# Patient Record
Sex: Male | Born: 1975 | Race: White | Hispanic: No | Marital: Single | Smoking: Former smoker
Health system: Southern US, Community
[De-identification: ages and names within clinical notes are randomized; demographics above are authoritative.]

---

## 2012-07-12 ENCOUNTER — Emergency Department (HOSPITAL_BASED_OUTPATIENT_CLINIC_OR_DEPARTMENT_OTHER)
Admission: EM | Admit: 2012-07-12 | Discharge: 2012-07-13 | Disposition: A | Discharge status: Home / Self Care | Payer: PRIVATE HEALTH INSURANCE | Attending: Emergency Medicine | Admitting: Emergency Medicine

## 2012-07-12 ENCOUNTER — Encounter (HOSPITAL_BASED_OUTPATIENT_CLINIC_OR_DEPARTMENT_OTHER): Payer: Self-pay | Admitting: *Deleted

## 2012-07-12 DIAGNOSIS — N390 Urinary tract infection, site not specified: Secondary | ICD-10-CM | POA: Insufficient documentation

## 2012-07-12 LAB — URINALYSIS, ROUTINE W REFLEX MICROSCOPIC
Protein, ur: 100 mg/dL — AB
Specific Gravity, Urine: 1.017 (ref 1.005–1.030)
Urobilinogen, UA: 1 mg/dL (ref 0.0–1.0)

## 2012-07-12 LAB — URINE MICROSCOPIC-ADD ON

## 2012-07-12 NOTE — ED Notes (Signed)
Pt c/o sudden onset of right flnak pain freq painful urination

## 2012-07-13 ENCOUNTER — Emergency Department (HOSPITAL_BASED_OUTPATIENT_CLINIC_OR_DEPARTMENT_OTHER): Payer: PRIVATE HEALTH INSURANCE

## 2012-07-13 MED ORDER — CIPROFLOXACIN HCL 500 MG PO TABS
500.0000 mg | ORAL_TABLET | Freq: Once | ORAL | Status: AC
  Administered 2012-07-13: 500 mg via ORAL
  Filled 2012-07-13: qty 1

## 2012-07-13 MED ORDER — CIPROFLOXACIN HCL 500 MG PO TABS: 500.0000 mg | ORAL_TABLET | Freq: Two times a day (BID) | ORAL | Status: AC

## 2012-07-13 MED ORDER — HYDROMORPHONE HCL 2 MG PO TABS: 2.0000 mg | ORAL_TABLET | ORAL | Status: AC | PRN

## 2012-07-13 NOTE — ED Provider Notes (Signed)
History   This chart was scribed for Hanley Seamen, MD by Sofie Rower. The patient was seen in room MH05/MH05 and the patient's care was started at 12:02AM.     CSN: [id number]  Arrival date & time 07/12/12  2312   First MD Initiated Contact with Patient 07/13/12 0002      Chief Complaint  Patient presents with  . Flank Pain    (Consider location/radiation/quality/duration/timing/severity/associated sxs/prior treatment) Patient is a 36 y.o. male presenting with flank pain. The history is provided by the patient. No language interpreter was used.  Flank Pain This is a new problem. The current episode started yesterday. The problem occurs constantly. The problem has been gradually worsening. Pertinent negatives include no chest pain, no abdominal pain, no headaches and no shortness of breath. Nothing aggravates the symptoms. Nothing relieves the symptoms. He has tried nothing for the symptoms. The treatment provided no relief.    Dustin Padilla is a 36 y.o. male  who presents to the Emergency Department complaining of sudden, progressively worsening, flank pain, located at the right flank, onset yesterday, with associated symptoms of dysuria and hematuria. The pt reports the flank pain he is experiencing is a constant pulsating sensation.   The pt denies chills, nausea, penile discharge and any hx of kidney stones.   The pt does not smoke or drink alcohol.   History reviewed. No pertinent past medical history.  History reviewed. No pertinent past surgical history.  History reviewed. No pertinent family history.  History  Substance Use Topics  . Smoking status: Former Games developer  . Smokeless tobacco: Not on file  . Alcohol Use: No      Review of Systems  Respiratory: Negative for shortness of breath.   Cardiovascular: Negative for chest pain.  Gastrointestinal: Negative for abdominal pain.  Genitourinary: Positive for flank pain.  Neurological: Negative for headaches.  All  other systems reviewed and are negative.    Allergies  Review of patient's allergies indicates no known allergies.  Home Medications  No current outpatient prescriptions on file.  BP 129/66  Pulse 73  Temp 98.7 F (37.1 C) (Oral)  Ht 5\' 11"  (1.803 m)  Wt 140 lb (63.504 kg)  BMI 19.53 kg/m2  SpO2 99%  Physical Exam  Nursing note and vitals reviewed. Constitutional: He is oriented to person, place, and time. He appears well-developed and well-nourished.  HENT:  Head: Atraumatic.  Right Ear: External ear normal.  Left Ear: External ear normal.  Nose: Nose normal.  Eyes: Conjunctivae normal and EOM are normal. Pupils are equal, round, and reactive to light.  Neck: Normal range of motion. Neck supple.  Cardiovascular: Normal rate, regular rhythm and normal heart sounds.   Pulmonary/Chest: Effort normal and breath sounds normal.  Abdominal: Soft. Bowel sounds are normal.  Genitourinary: Penis normal.       No penile discharge or erythema detected.   Musculoskeletal: Normal range of motion. He exhibits no tenderness.  Neurological: He is alert and oriented to person, place, and time.  Skin: Skin is warm and dry.  Psychiatric: He has a normal mood and affect. His behavior is normal.    ED Course  Procedures (including critical care time)  DIAGNOSTIC STUDIES: Oxygen Saturation is 99% on room air, normal by my interpretation.    COORDINATION OF CARE:    12:05AM- CT scan and pain management discussed with patient. Pt agrees with treatment.     MDM   Nursing notes and vitals signs, including pulse oximetry,  reviewed.  Summary of this visit's results, reviewed by myself:  Labs:  Results for orders placed during the hospital encounter of 07/12/12  URINALYSIS, ROUTINE W REFLEX MICROSCOPIC      Component Value Range   Color, Urine YELLOW  YELLOW   APPearance CLOUDY (*) CLEAR   Specific Gravity, Urine 1.017  1.005 - 1.030   pH 6.5  5.0 - 8.0   Glucose, UA NEGATIVE   NEGATIVE mg/dL   Hgb urine dipstick LARGE (*) NEGATIVE   Bilirubin Urine NEGATIVE  NEGATIVE   Ketones, ur NEGATIVE  NEGATIVE mg/dL   Protein, ur 161 (*) NEGATIVE mg/dL   Urobilinogen, UA 1.0  0.0 - 1.0 mg/dL   Nitrite POSITIVE (*) NEGATIVE   Leukocytes, UA LARGE (*) NEGATIVE  URINE MICROSCOPIC-ADD ON      Component Value Range   Squamous Epithelial / LPF FEW (*) RARE   WBC, UA TOO NUMEROUS TO COUNT  <3 WBC/hpf   RBC / HPF TOO NUMEROUS TO COUNT  <3 RBC/hpf   Bacteria, UA MANY (*) RARE    Imaging Studies: Ct Abdomen Pelvis Wo Contrast  07/13/2012  *RADIOLOGY REPORT*  Clinical Data: 36 year old male right flank pain and hematuria.  CT ABDOMEN AND PELVIS WITHOUT CONTRAST  Technique:  Multidetector CT imaging of the abdomen and pelvis was performed following the standard protocol without intravenous contrast.  Comparison: None.  Findings: Clear lung bases.  No pericardial or pleural effusion. No osseous abnormality.  No pelvic free fluid.  Redundant but otherwise negative distal colon.  Mildly redundant splenic and hepatic flexures.  The cecum is located in the right hemi pelvis.  Normal appendix exits the cecum posteriorly and extends cephalad along the right pelvic sidewall.  No dilated small bowel.  Noncontrast stomach and duodenum within normal limits.  Negative noncontrast liver, gallbladder, spleen, pancreas and adrenal glands.  No left hydronephrosis or nephrolithiasis.  Normal course of the left ureter.  No right hydronephrosis or nephrolithiasis, but there is right periureteral stranding, severe about 4 cm distal to the right ureteropelvic junction where there is trace retroperitoneal fluid (series 2 image 39).  The ureter below this level appears mildly dilated and indistinct.  The distal right ureter is difficult to delineate.  There is no calcification along the course of the right ureter.  The bladder is diminutive but thick-walled and indistinct with some perivesical stranding.  Mild  stranding at the seminal vesicles.  Noncontrast prostate appears within normal limits.  IMPRESSION: 1.  Perivesical stranding, seminal vesicle stranding, and ascending right periureteral stranding with trace periureteral fluid in the right retroperitoneum.  No urologic calculus or obstructing etiology. Favor this appearance reflects an ascending urinary tract infection and recommend correlation with urinalysis/urine culture. 2.  Normal appendix.  No other inflammatory changes identified in the abdomen or pelvis.   Original Report Authenticated By: Harley Hallmark, M.D.           I personally performed the services described in this documentation, which was scribed in my presence.  The recorded information has been reviewed and considered.     Hanley Seamen, MD 07/13/12 [phone]

## 2012-07-14 LAB — GC/CHLAMYDIA PROBE AMP, URINE
Chlamydia, Swab/Urine, PCR: NEGATIVE
GC Probe Amp, Urine: NEGATIVE

## 2012-07-15 LAB — URINE CULTURE: Colony Count: >=100000

## 2012-07-16 NOTE — ED Notes (Signed)
+   Urine Patient treated with Cipro-STS

## 2013-03-20 IMAGING — CT CT ABD-PELV W/O CM
2 of 4 series · 15 of 46 positions shown, 17 images · non-contrast
Comparison: None.

CLINICAL DATA: 36-year-old male right flank pain and hematuria.

CT ABDOMEN AND PELVIS WITHOUT CONTRAST
TECHNIQUE: Multidetector CT imaging of the abdomen and pelvis was
performed following the standard protocol without intravenous
contrast.

[Series 2: renal stone < 200 lbs 5.0 b31f · axial · 0.70mm/px · z∈[+690,+1115]mm · 12 of 93 slices shown, 14 images]
[im 4/93  soft-tissue]
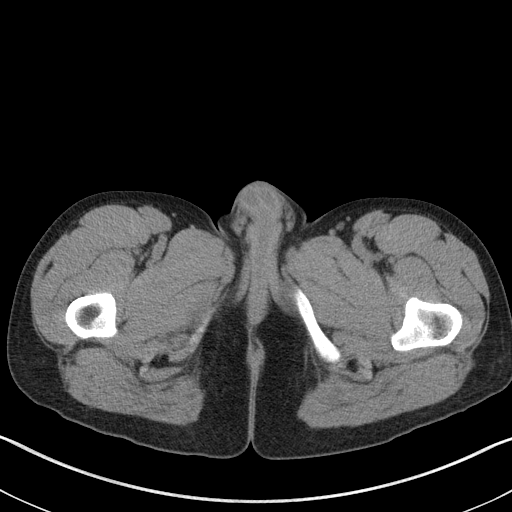
[im 4/93  bone]
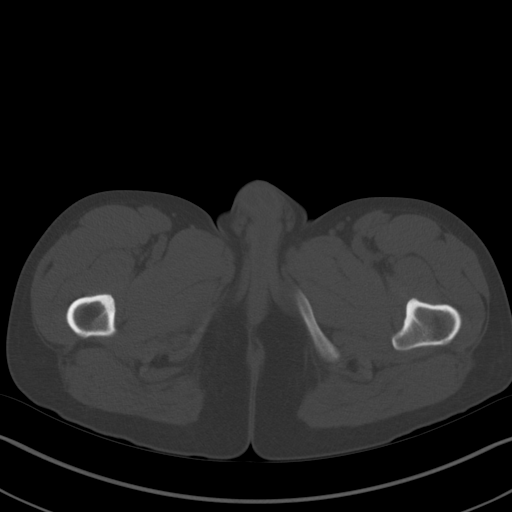
[im 12/93  soft-tissue]
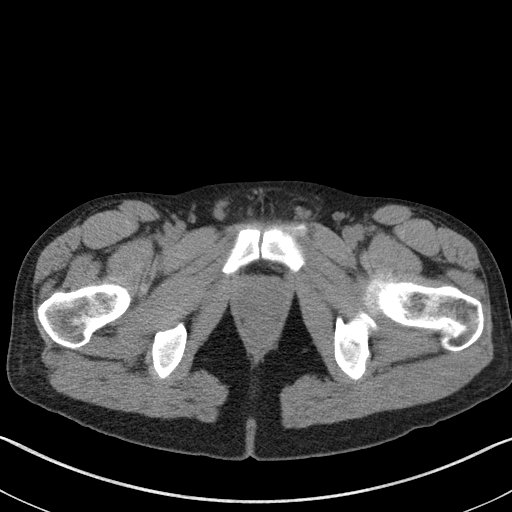
[im 20/93  soft-tissue]
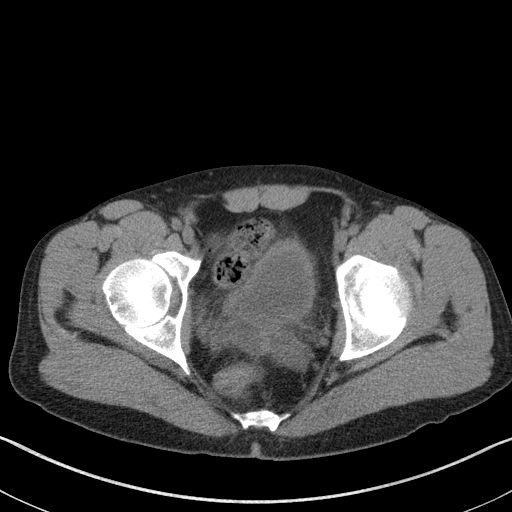
[im 27/93  soft-tissue]
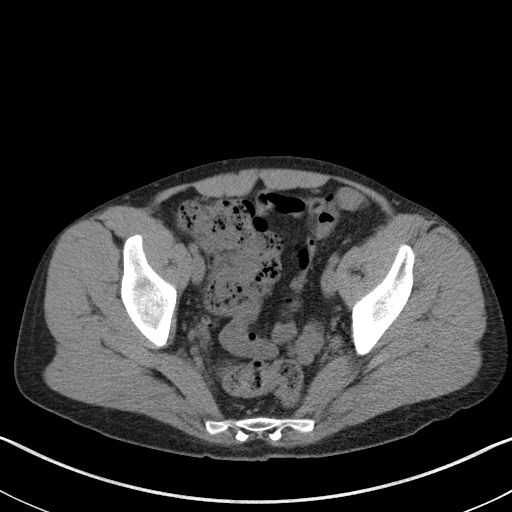
[im 35/93  soft-tissue]
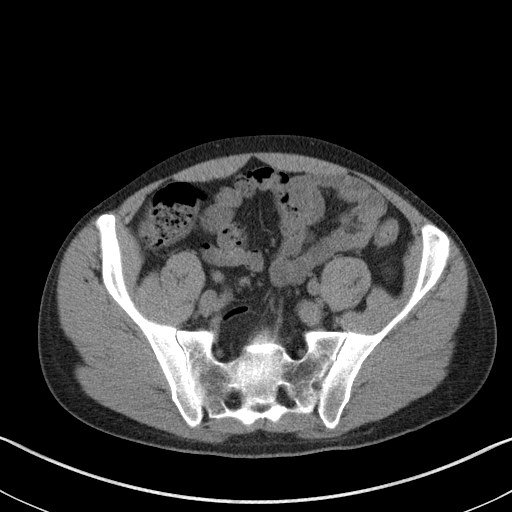
[im 43/93  soft-tissue]
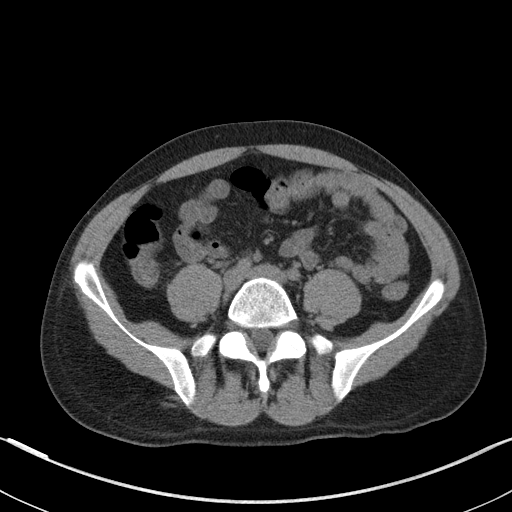
[im 50/93  soft-tissue]
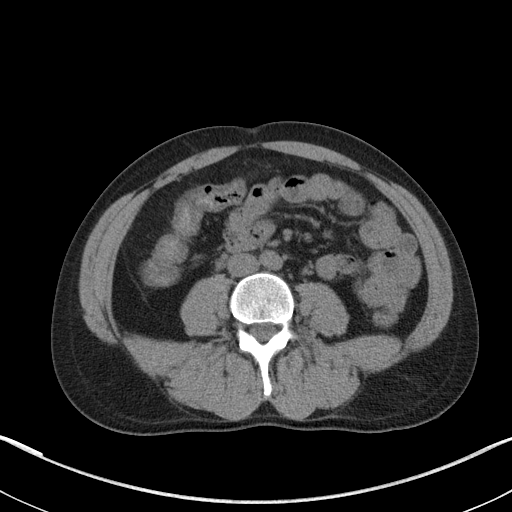
[im 58/93  soft-tissue]
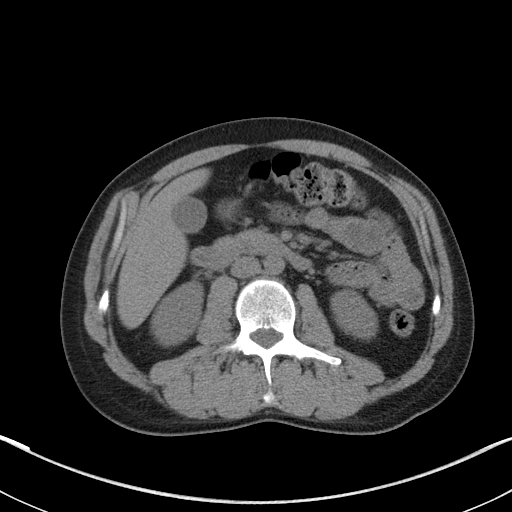
[im 66/93  soft-tissue]
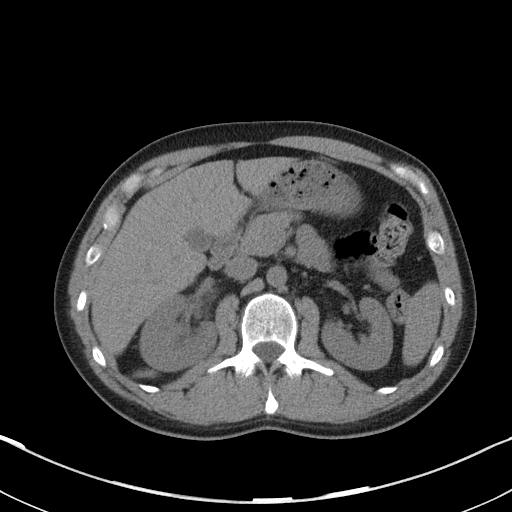
[im 66/93  bone]
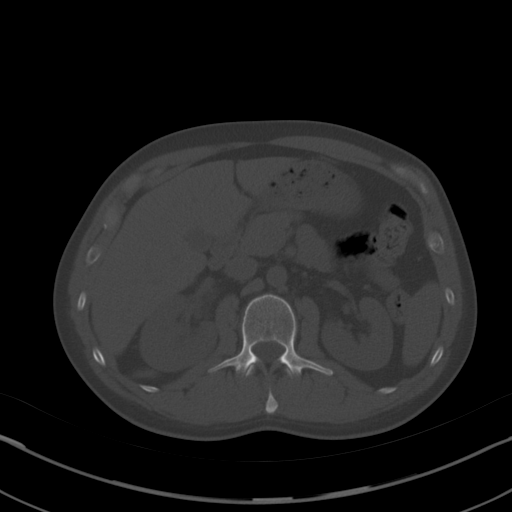
[im 73/93  soft-tissue]
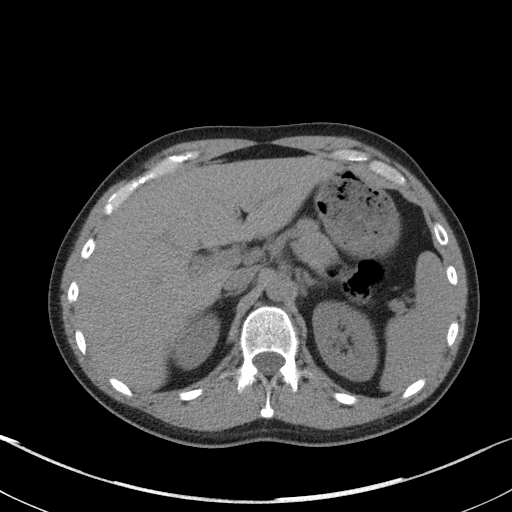
[im 81/93  soft-tissue]
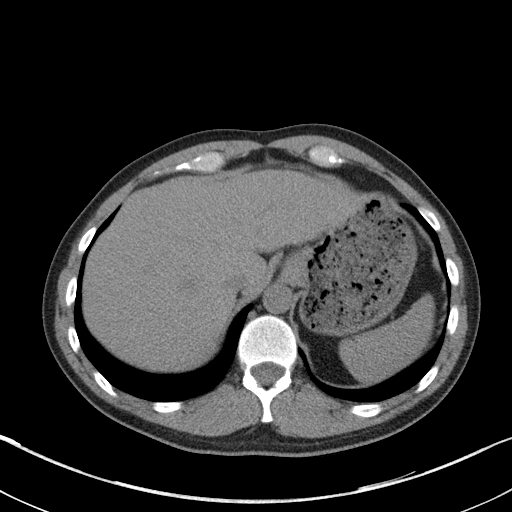
[im 89/93  soft-tissue]
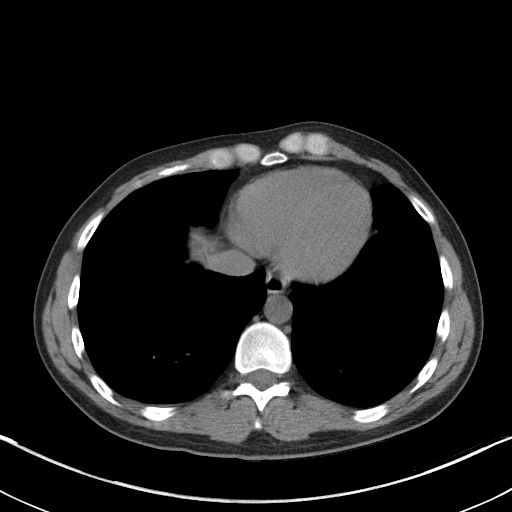

[Series 5: renal stone 3.0 coronal · coronal · 0.63mm/px · 3 of 72 slices shown]
[im 24/72  soft-tissue]
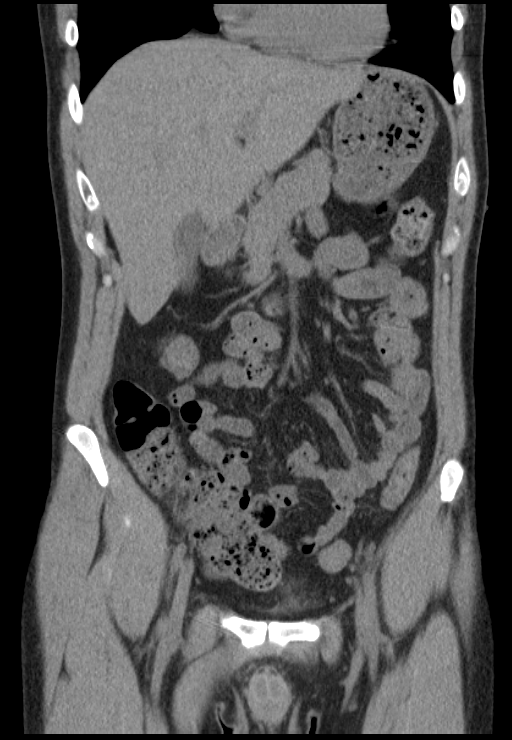
[im 32/72  soft-tissue]
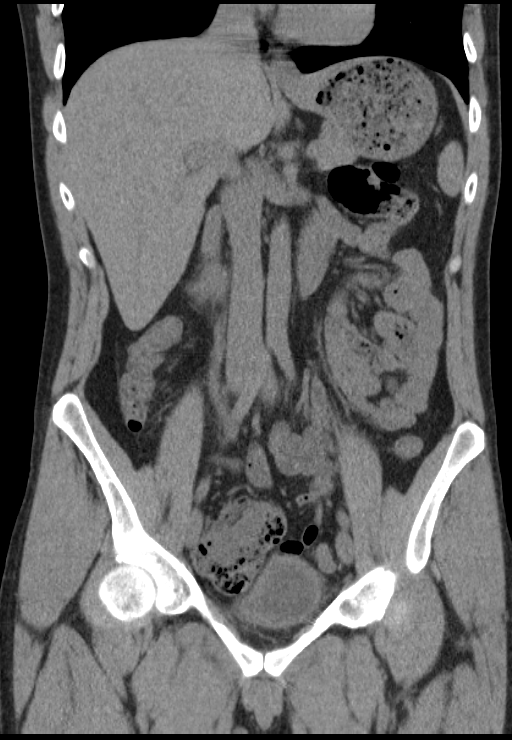
[im 40/72  soft-tissue]
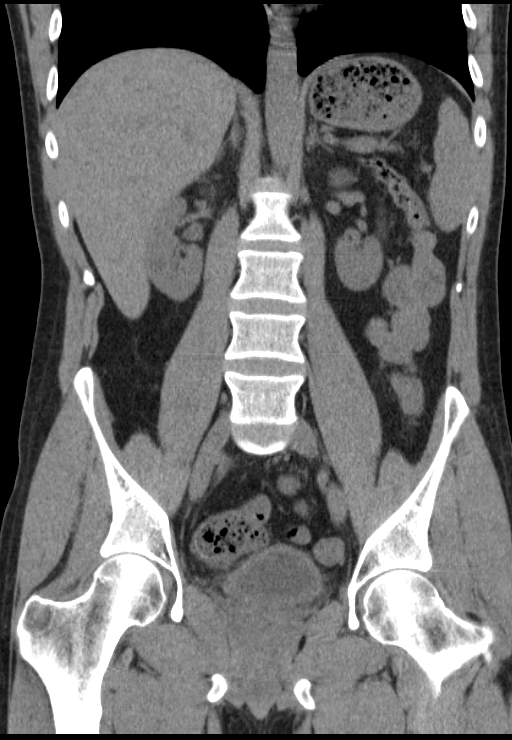

[15 of 46 positions shown; findings below may reference images not displayed]

FINDINGS: Clear lung bases.  No pericardial or pleural effusion.
No osseous abnormality.

No pelvic free fluid.  Redundant but otherwise negative distal
colon.  Mildly redundant splenic and hepatic flexures.  The cecum
is located in the right hemi pelvis.  Normal appendix exits the
cecum posteriorly and extends cephalad along the right pelvic
sidewall.  No dilated small bowel.  Noncontrast stomach and
duodenum within normal limits.

Negative noncontrast liver, gallbladder, spleen, pancreas and
adrenal glands.

No left hydronephrosis or nephrolithiasis.  Normal course of the
left ureter.

No right hydronephrosis or nephrolithiasis, but there is right
periureteral stranding, severe about 4 cm distal to the right
ureteropelvic junction where there is trace retroperitoneal fluid
(series 2 image 39).  The ureter below this level appears mildly
dilated and indistinct.  The distal right ureter is difficult to
delineate.  There is no calcification along the course of the right
ureter.  The bladder is diminutive but thick-walled and indistinct
with some perivesical stranding.  Mild stranding at the seminal
vesicles.  Noncontrast prostate appears within normal limits.
IMPRESSION: 1.  Perivesical stranding, seminal vesicle stranding, and ascending
right periureteral stranding with trace periureteral fluid in the
right retroperitoneum.  No urologic calculus or obstructing
etiology.
Favor this appearance reflects an ascending urinary tract infection
and recommend correlation with urinalysis/urine culture.
2.  Normal appendix.  No other inflammatory changes identified in
the abdomen or pelvis.

## 2020-12-16 ENCOUNTER — Emergency Department (HOSPITAL_BASED_OUTPATIENT_CLINIC_OR_DEPARTMENT_OTHER)
Admission: EM | Admit: 2020-12-16 | Discharge: 2020-12-16 | Disposition: A | Discharge status: Home / Self Care | Payer: BC Managed Care – PPO | Attending: Emergency Medicine | Admitting: Emergency Medicine

## 2020-12-16 ENCOUNTER — Encounter (HOSPITAL_BASED_OUTPATIENT_CLINIC_OR_DEPARTMENT_OTHER): Payer: Self-pay

## 2020-12-16 ENCOUNTER — Other Ambulatory Visit: Payer: Self-pay

## 2020-12-16 ENCOUNTER — Emergency Department (HOSPITAL_BASED_OUTPATIENT_CLINIC_OR_DEPARTMENT_OTHER): Payer: BC Managed Care – PPO

## 2020-12-16 DIAGNOSIS — R1084 Generalized abdominal pain: Secondary | ICD-10-CM | POA: Insufficient documentation

## 2020-12-16 DIAGNOSIS — T8062XA Other serum reaction due to vaccination, initial encounter: Secondary | ICD-10-CM | POA: Insufficient documentation

## 2020-12-16 DIAGNOSIS — T50Z95A Adverse effect of other vaccines and biological substances, initial encounter: Secondary | ICD-10-CM

## 2020-12-16 DIAGNOSIS — Z87891 Personal history of nicotine dependence: Secondary | ICD-10-CM | POA: Insufficient documentation

## 2020-12-16 DIAGNOSIS — M79603 Pain in arm, unspecified: Secondary | ICD-10-CM | POA: Diagnosis not present

## 2020-12-16 DIAGNOSIS — M791 Myalgia, unspecified site: Secondary | ICD-10-CM | POA: Diagnosis not present

## 2020-12-16 DIAGNOSIS — R079 Chest pain, unspecified: Secondary | ICD-10-CM | POA: Diagnosis present

## 2020-12-16 LAB — CBC WITH DIFFERENTIAL/PLATELET
Abs Immature Granulocytes: 0.03 10*3/uL (ref 0.00–0.07)
Basophils Absolute: 0 10*3/uL (ref 0.0–0.1)
Basophils Relative: 0 %
Eosinophils Absolute: 0.3 10*3/uL (ref 0.0–0.5)
Eosinophils Relative: 3 %
HCT: 44.3 % (ref 39.0–52.0)
Hemoglobin: 15.5 g/dL (ref 13.0–17.0)
Immature Granulocytes: 0 %
Lymphocytes Relative: 28 %
Lymphs Abs: 3.1 10*3/uL (ref 0.7–4.0)
MCH: 30 pg (ref 26.0–34.0)
MCHC: 35 g/dL (ref 30.0–36.0)
MCV: 85.9 fL (ref 80.0–100.0)
Monocytes Absolute: 0.8 10*3/uL (ref 0.1–1.0)
Monocytes Relative: 7 %
Neutro Abs: 6.8 10*3/uL (ref 1.7–7.7)
Neutrophils Relative %: 62 %
Platelets: 313 10*3/uL (ref 150–400)
RBC: 5.16 MIL/uL (ref 4.22–5.81)
RDW: 12.4 % (ref 11.5–15.5)
WBC: 11.1 10*3/uL — ABNORMAL HIGH (ref 4.0–10.5)
nRBC: 0 % (ref 0.0–0.2)

## 2020-12-16 LAB — COMPREHENSIVE METABOLIC PANEL
ALT: 40 U/L (ref 0–44)
AST: 61 U/L — ABNORMAL HIGH (ref 15–41)
Albumin: 4 g/dL (ref 3.5–5.0)
Alkaline Phosphatase: 57 U/L (ref 38–126)
Anion gap: 11 (ref 5–15)
BUN: 15 mg/dL (ref 6–20)
CO2: 26 mmol/L (ref 22–32)
Calcium: 8.9 mg/dL (ref 8.9–10.3)
Chloride: 102 mmol/L (ref 98–111)
Creatinine, Ser: 1.34 mg/dL — ABNORMAL HIGH (ref 0.61–1.24)
GFR, Estimated: >60 mL/min (ref >60)
Glucose, Bld: 144 mg/dL — ABNORMAL HIGH (ref 70–99)
Potassium: 3.5 mmol/L (ref 3.5–5.1)
Sodium: 139 mmol/L (ref 135–145)
Total Bilirubin: 0.4 mg/dL (ref 0.3–1.2)
Total Protein: 6.7 g/dL (ref 6.5–8.1)

## 2020-12-16 LAB — LIPASE, BLOOD: Lipase: 34 U/L (ref 11–51)

## 2020-12-16 LAB — TROPONIN I (HIGH SENSITIVITY): Troponin I (High Sensitivity): <2 ng/L (ref <18)

## 2020-12-16 MED ORDER — MORPHINE SULFATE (PF) 4 MG/ML IV SOLN
4.0000 mg | Freq: Once | INTRAVENOUS | Status: AC
  Administered 2020-12-16: 4 mg via INTRAVENOUS
  Filled 2020-12-16: qty 1

## 2020-12-16 MED ORDER — SODIUM CHLORIDE 0.9 % IV BOLUS
1000.0000 mL | Freq: Once | INTRAVENOUS | Status: AC
  Administered 2020-12-16: 1000 mL via INTRAVENOUS

## 2020-12-16 MED ORDER — ONDANSETRON 4 MG PO TBDP
4.0000 mg | ORAL_TABLET | Freq: Once | ORAL | Status: AC
  Administered 2020-12-16: 4 mg via ORAL
  Filled 2020-12-16: qty 1

## 2020-12-16 NOTE — ED Triage Notes (Signed)
Pt states approx 1am he started hurting around the bottom of his ribs. States pain is now all over and he is having trouble breathing. Sats 99%.

## 2020-12-16 NOTE — Discharge Instructions (Signed)
Your workup here was unremarkable.  There were no signs of heart damage on your labwork.  Your chest xray was normal.  I think most likely you are having a vaccine reaction.  Please return for worsening symptoms.   Take 4 over the counter ibuprofen tablets 3 times a day or 2 over-the-counter naproxen tablets twice a day for pain. Also take tylenol 1000mg (2 extra strength) four times a day.

## 2020-12-16 NOTE — ED Provider Notes (Signed)
[facility] EMERGENCY DEPARTMENT Provider Note   CSN: [id number] Arrival date & time: 12/16/20  2025     History Chief Complaint  Patient presents with  . Chest Pain    Dustin Padilla is a 45 y.o. male.  45 yo M with a chief complaints of chest pain.  This is been going on for about 3 hours now.  The patient was having pain in his arm after getting his Covid booster shot yesterday and then started having pain in his ribs and now feels like he hurts all over.  Thinks maybe his pain is worse in his chest but has difficulty figuring out what hurts in the worst.  No fevers or chills no cough or congestion no nausea vomiting or diarrhea.  The history is provided by the patient.  Chest Pain Associated symptoms: no abdominal pain, no fever, no headache, no palpitations, no shortness of breath and no vomiting   Illness Severity:  Moderate Onset quality:  Gradual Duration:  3 hours Timing:  Constant Progression:  Unchanged Chronicity:  New Associated symptoms: chest pain and myalgias   Associated symptoms: no abdominal pain, no congestion, no diarrhea, no fever, no headaches, no rash, no shortness of breath and no vomiting        History reviewed. No pertinent past medical history.  There are no problems to display for this patient.   History reviewed. No pertinent surgical history.     History reviewed. No pertinent family history.  Social History   Tobacco Use  . Smoking status: Former Smoker  Substance Use Topics  . Alcohol use: No  . Drug use: No    Home Medications Prior to Admission medications   Medication Sig Start Date End Date Taking? Authorizing Provider  ciprofloxacin (CIPRO) 500 MG tablet Take 1 tablet (500 mg total) by mouth 2 (two) times daily. 07/13/12   Molpus, John, MD  HYDROmorphone (DILAUDID) 2 MG tablet Take 1 tablet (2 mg total) by mouth every 4 (four) hours as needed for pain. 07/13/12   Molpus, Jonny Ruiz, MD    Allergies    Patient has  no known allergies.  Review of Systems   Review of Systems  Constitutional: Negative for chills and fever.  HENT: Negative for congestion and facial swelling.   Eyes: Negative for discharge and visual disturbance.  Respiratory: Negative for shortness of breath.   Cardiovascular: Positive for chest pain. Negative for palpitations.  Gastrointestinal: Negative for abdominal pain, diarrhea and vomiting.  Musculoskeletal: Positive for arthralgias and myalgias.  Skin: Negative for color change and rash.  Neurological: Negative for tremors, syncope and headaches.  Psychiatric/Behavioral: Negative for confusion and dysphoric mood.    Physical Exam Updated Vital Signs BP (!) 107/49 (BP Location: Right Arm)   Pulse 64   Temp 97.7 F (36.5 C) (Oral)   Resp 16   Ht 5\' 11"  (1.803 m)   Wt 77.1 kg   SpO2 99%   BMI 23.71 kg/m   Physical Exam Vitals and nursing note reviewed.  Constitutional:      Appearance: He is well-developed.  HENT:     Head: Normocephalic and atraumatic.  Eyes:     Pupils: Pupils are equal, round, and reactive to light.  Neck:     Vascular: No JVD.  Cardiovascular:     Rate and Rhythm: Normal rate and regular rhythm.     Heart sounds: No murmur heard. No friction rub. No gallop.   Pulmonary:     Effort: No  respiratory distress.     Breath sounds: No wheezing.  Abdominal:     General: There is no distension.     Tenderness: There is abdominal tenderness. There is no guarding or rebound.     Comments: Mild diffuse abdominal tenderness  Musculoskeletal:        General: Normal range of motion.     Cervical back: Normal range of motion and neck supple.  Skin:    Coloration: Skin is not pale.     Findings: No rash.  Neurological:     Mental Status: He is alert and oriented to person, place, and time.  Psychiatric:        Behavior: Behavior normal.     ED Results / Procedures / Treatments   Labs (all labs ordered are listed, but only abnormal results  are displayed) Labs Reviewed  CBC WITH DIFFERENTIAL/PLATELET - Abnormal; Notable for the following components:      Result Value   WBC 11.1 (*)    All other components within normal limits  COMPREHENSIVE METABOLIC PANEL - Abnormal; Notable for the following components:   Glucose, Bld 144 (*)    Creatinine, Ser 1.34 (*)    AST 61 (*)    All other components within normal limits  LIPASE, BLOOD  TROPONIN I (HIGH SENSITIVITY)    EKG EKG Interpretation  Date/Time:  Sunday December 16 2020 03:24:39 EDT Ventricular Rate:  77 PR Interval:    QRS Duration: 101 QT Interval:  374 QTC Calculation: 424 R Axis:   72 Text Interpretation: Sinus rhythm Low voltage, precordial leads Borderline T wave abnormalities Minimal ST elevation, inferior leads Baseline wander in lead(s) II III aVL aVF V1 V2 V3 V4 V5 V6 TECHNICALLY DIFFICULT No old tracing to compare Confirmed by Serafino Burciaga ([id number]) on 12/16/2020 3:41:11 AM   Radiology DG Chest Port 1 View  Result Date: 12/16/2020 CLINICAL DATA:  44 year old male with chest pain, lower rib pain, and shortness of breath. EXAM: PORTABLE CHEST 1 VIEW COMPARISON:  None. FINDINGS: Portable AP semi upright view at 0348 hours. Low normal lung volumes. Normal cardiac size and mediastinal contours. Visualized tracheal air column is within normal limits. No pneumothorax. Allowing for portable technique the lungs are clear. No osseous abnormality identified. Negative visible bowel gas pattern. IMPRESSION: Negative portable chest. Electronically Signed   By: H  Hall M.D.   On: 12/16/2020 04:31    Procedures Procedures   Medications Ordered in ED Medications  sodium chloride 0.9 % bolus 1,000 mL (0 mLs Intravenous Stopped 12/16/20 0504)  ondansetron (ZOFRAN-ODT) disintegrating tablet 4 mg (4 mg Oral Given 12/16/20 0338)  morphine 4 MG/ML injection 4 mg (4 mg Intravenous Given 12/16/20 0337)    ED Course  I have reviewed the triage vital signs and the nursing  notes.  Pertinent labs & imaging results that were available during my care of the patient were reviewed by me and considered in my medical decision making (see chart for details).    MDM Rules/Calculators/A&P                          44  yo M with a chief complaints of pain all over his body.  The patient may be having a reaction to the Covid booster shot.  Complaining of pain all over his body.  Has trouble describing it.  Has trouble localizing it.  Took some ibuprofen but just prior to arrival.  He has mild diffuse abdominal tenderness  on exam.  Will obtain laboratory evaluation.  Chest x-ray.  Reassess.  Repeat ECG without any concerning ischemic findings.  Troponin is completely unmeasurable.  Will discharge the patient home.  PCP follow-up.  5:06 AM:  I have discussed the diagnosis/risks/treatment options with the patient and family and believe the pt to be eligible for discharge home to follow-up with PCP. We also discussed returning to the ED immediately if new or worsening sx occur. We discussed the sx which are most concerning (e.g., sudden worsening pain, fever, inability to tolerate by mouth) that necessitate immediate return. Medications administered to the patient during their visit and any new prescriptions provided to the patient are listed below.  Medications given during this visit Medications  sodium chloride 0.9 % bolus 1,000 mL (0 mLs Intravenous Stopped 12/16/20 0504)  ondansetron (ZOFRAN-ODT) disintegrating tablet 4 mg (4 mg Oral Given 12/16/20 0338)  morphine 4 MG/ML injection 4 mg (4 mg Intravenous Given 12/16/20 3846)     The patient appears reasonably screen and/or stabilized for discharge and I doubt any other medical condition or other [facility] requiring further screening, evaluation, or treatment in the ED at this time prior to discharge.   Final Clinical Impression(s) / ED Diagnoses Final diagnoses:  Immunization reaction, initial encounter    Rx / DC Orders ED  Discharge Orders    None       Melene Plan, DO 12/16/20 [phone]

## 2021-08-23 IMAGING — DX DG CHEST 1V PORT
1 series · 1 of 1 positions shown · non-contrast
Comparison: None.

CLINICAL DATA: 44-year-old male with chest pain, lower rib pain,
and shortness of breath.

EXAM:
PORTABLE CHEST 1 VIEW

[chest ap]
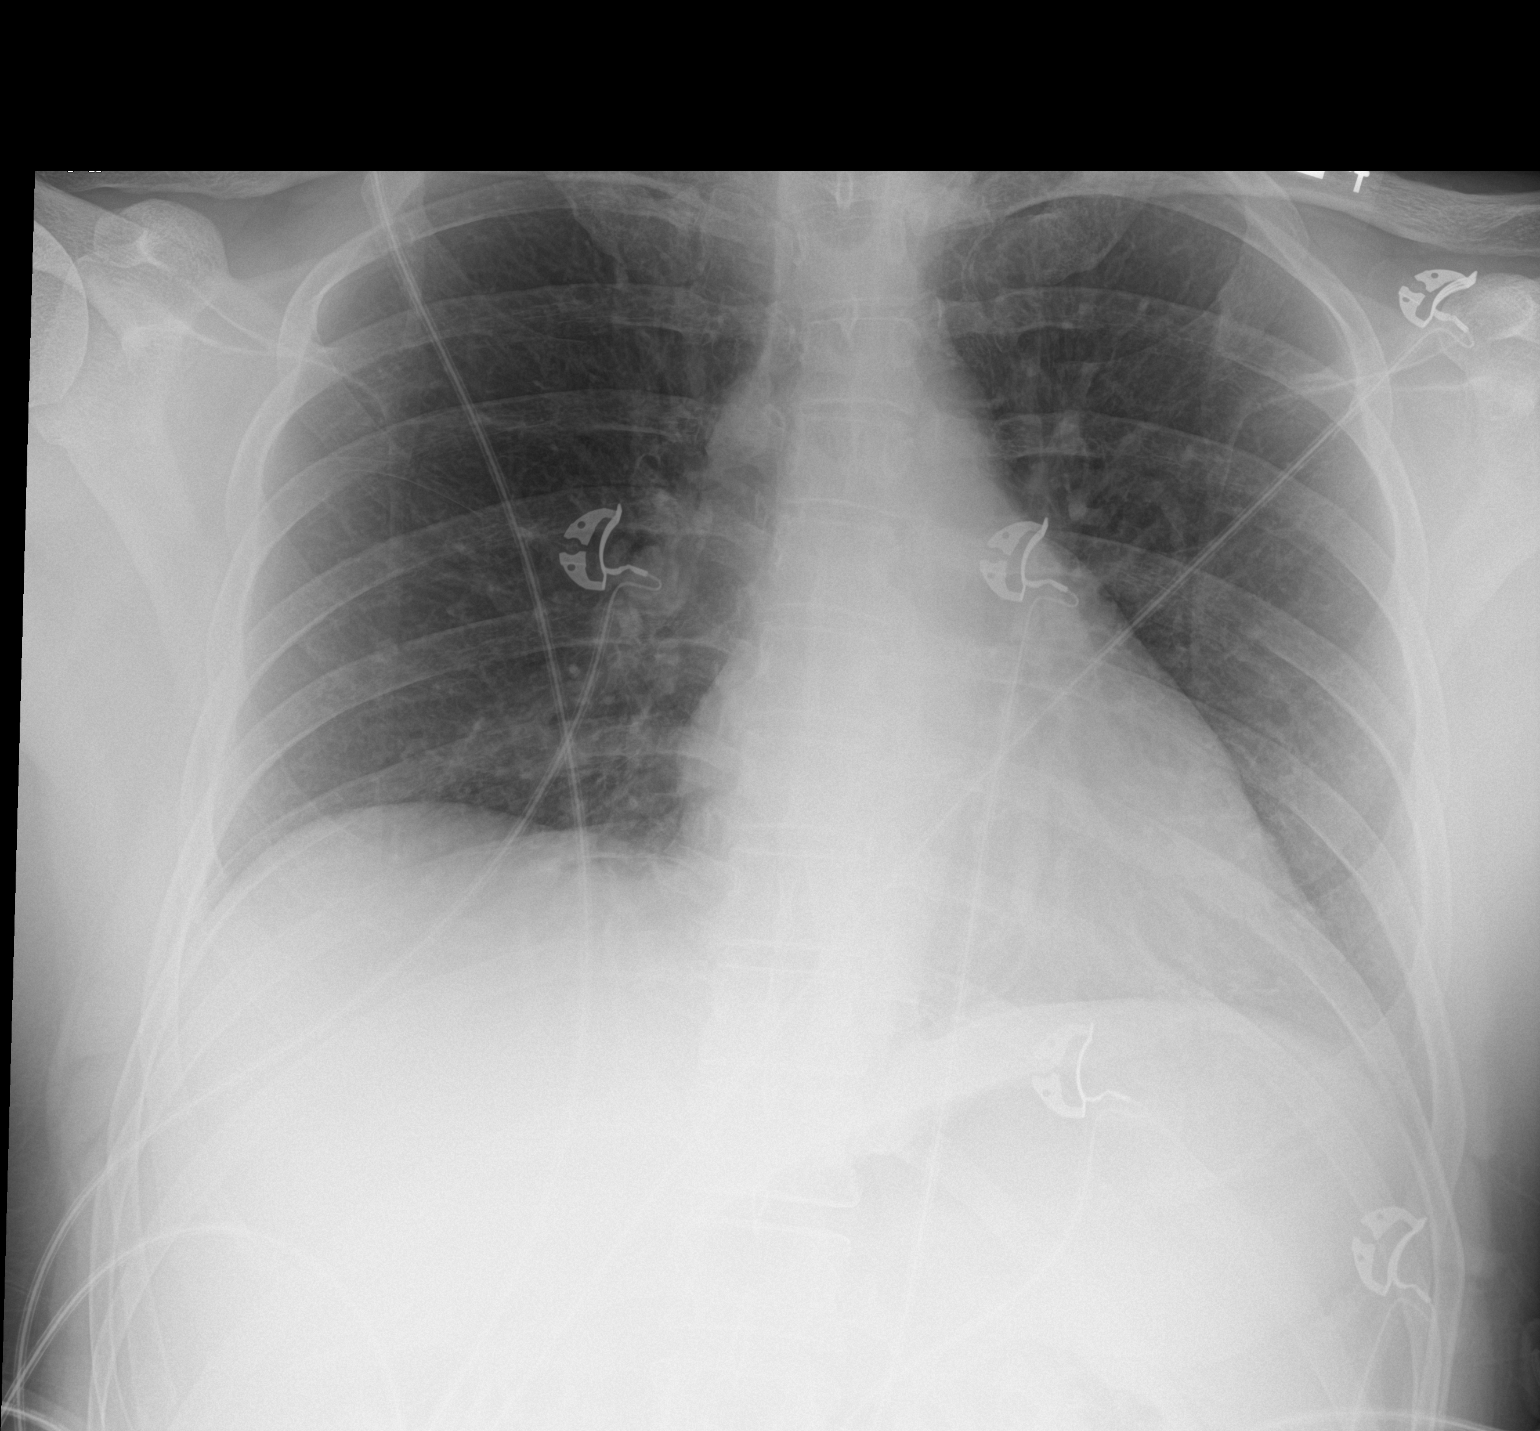

[1 of 1 positions shown; findings below may reference images not displayed]

FINDINGS: Portable AP semi upright view at 6598 hours. Low normal lung
volumes. Normal cardiac size and mediastinal contours. Visualized
tracheal air column is within normal limits. No pneumothorax.
Allowing for portable technique the lungs are clear. No osseous
abnormality identified. Negative visible bowel gas pattern.
IMPRESSION: Negative portable chest.
